# Patient Record
Sex: Female | Born: 2005 | Race: White | Hispanic: Yes | Marital: Single | Smoking: Never smoker
Health system: Southern US, Community
[De-identification: ages and names within clinical notes are randomized; demographics above are authoritative.]

## PROBLEM LIST (undated history)

## (undated) DIAGNOSIS — F909 Attention-deficit hyperactivity disorder, unspecified type: Secondary | ICD-10-CM

## (undated) DIAGNOSIS — H6690 Otitis media, unspecified, unspecified ear: Secondary | ICD-10-CM

## (undated) HISTORY — PX: OOPHORECTOMY: SHX86

## (undated) HISTORY — PX: TYMPANOSTOMY TUBE PLACEMENT: SHX32

## (undated) HISTORY — PX: APPENDECTOMY: SHX54

---
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
## 2024-01-15 ENCOUNTER — Encounter (HOSPITAL_COMMUNITY): Payer: Self-pay

## 2024-01-15 ENCOUNTER — Other Ambulatory Visit: Payer: Self-pay

## 2024-01-15 ENCOUNTER — Emergency Department (HOSPITAL_COMMUNITY)

## 2024-01-15 ENCOUNTER — Emergency Department (HOSPITAL_COMMUNITY)
Admission: EM | Admit: 2024-01-15 | Discharge: 2024-01-15 | Disposition: A | Discharge status: Home / Self Care | Attending: Student in an Organized Health Care Education/Training Program | Admitting: Student in an Organized Health Care Education/Training Program

## 2024-01-15 DIAGNOSIS — M25512 Pain in left shoulder: Secondary | ICD-10-CM | POA: Diagnosis present

## 2024-01-15 DIAGNOSIS — M545 Low back pain, unspecified: Secondary | ICD-10-CM | POA: Diagnosis not present

## 2024-01-15 DIAGNOSIS — Y92410 Unspecified street and highway as the place of occurrence of the external cause: Secondary | ICD-10-CM | POA: Insufficient documentation

## 2024-01-15 MED ORDER — IBUPROFEN 400 MG PO TABS
400.0000 mg | ORAL_TABLET | Freq: Once | ORAL | Status: AC
  Administered 2024-01-15: 400 mg via ORAL
  Filled 2024-01-15: qty 1
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
## 2024-01-15 NOTE — ED Notes (Signed)
 Patient transported to X-ray

## 2024-01-15 NOTE — ED Notes (Signed)
 PT  c/o of left shoulder pain and weakness, back pain and right shoulder pain which is not as bad as the left shoulder
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
## 2024-01-15 NOTE — Discharge Instructions (Signed)
 You were seen in the emergency department today for an urgent medical evaluation.  Your workup did not show any abnormalities that warranted hospital admission at this time.  However, we highly encourage you to follow-up with your primary care physician for reevaluation and discussion of your recent ER visit.   We recommend you return to the emergency department if you develop any severe chest pain, respiratory distress, significant worsening of your current symptoms, or you have any other questions/concerns. Thank you for trusting [place] with your care.
